# Patient Record
Sex: Male | Born: 1960 | Race: White | Hispanic: No | Marital: Single | Smoking: Never smoker
Health system: Southern US, Community
[De-identification: ages and names within clinical notes are randomized; demographics above are authoritative.]

## PROBLEM LIST (undated history)

## (undated) DIAGNOSIS — G8929 Other chronic pain: Secondary | ICD-10-CM

## (undated) DIAGNOSIS — I10 Essential (primary) hypertension: Secondary | ICD-10-CM

## (undated) DIAGNOSIS — M545 Low back pain: Secondary | ICD-10-CM

## (undated) DIAGNOSIS — M1990 Unspecified osteoarthritis, unspecified site: Secondary | ICD-10-CM

## (undated) DIAGNOSIS — G40909 Epilepsy, unspecified, not intractable, without status epilepticus: Secondary | ICD-10-CM

## (undated) DIAGNOSIS — R5383 Other fatigue: Secondary | ICD-10-CM

## (undated) HISTORY — PX: OTHER SURGICAL HISTORY: SHX169

## (undated) HISTORY — DX: Epilepsy, unspecified, not intractable, without status epilepticus: G40.909

## (undated) HISTORY — PX: CHOLECYSTECTOMY: SHX55

## (undated) HISTORY — DX: Low back pain: M54.5

## (undated) HISTORY — DX: Other chronic pain: G89.29

## (undated) HISTORY — DX: Unspecified osteoarthritis, unspecified site: M19.90

## (undated) HISTORY — DX: Essential (primary) hypertension: I10

## (undated) HISTORY — DX: Other fatigue: R53.83

---

## 2005-03-22 ENCOUNTER — Inpatient Hospital Stay (HOSPITAL_COMMUNITY): Admission: EM | Admit: 2005-03-22 | Discharge: 2005-03-23 | Payer: Self-pay | Admitting: Emergency Medicine
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
## 2014-08-27 DIAGNOSIS — G894 Chronic pain syndrome: Secondary | ICD-10-CM | POA: Diagnosis not present

## 2014-08-27 DIAGNOSIS — R413 Other amnesia: Secondary | ICD-10-CM | POA: Diagnosis not present

## 2015-04-03 DIAGNOSIS — G894 Chronic pain syndrome: Secondary | ICD-10-CM | POA: Diagnosis not present

## 2015-04-03 DIAGNOSIS — R413 Other amnesia: Secondary | ICD-10-CM | POA: Diagnosis not present

## 2015-08-14 DIAGNOSIS — R413 Other amnesia: Secondary | ICD-10-CM | POA: Diagnosis not present

## 2015-08-14 DIAGNOSIS — G894 Chronic pain syndrome: Secondary | ICD-10-CM | POA: Diagnosis not present

## 2016-02-19 DIAGNOSIS — R413 Other amnesia: Secondary | ICD-10-CM | POA: Diagnosis not present

## 2016-02-19 DIAGNOSIS — G894 Chronic pain syndrome: Secondary | ICD-10-CM | POA: Diagnosis not present

## 2016-11-16 DIAGNOSIS — R413 Other amnesia: Secondary | ICD-10-CM | POA: Diagnosis not present

## 2016-11-16 DIAGNOSIS — G894 Chronic pain syndrome: Secondary | ICD-10-CM | POA: Diagnosis not present

## 2017-08-25 DIAGNOSIS — Z125 Encounter for screening for malignant neoplasm of prostate: Secondary | ICD-10-CM | POA: Diagnosis not present

## 2017-08-25 DIAGNOSIS — I10 Essential (primary) hypertension: Secondary | ICD-10-CM | POA: Diagnosis not present

## 2017-08-25 DIAGNOSIS — Z13220 Encounter for screening for lipoid disorders: Secondary | ICD-10-CM | POA: Diagnosis not present

## 2017-08-31 DIAGNOSIS — M545 Low back pain: Secondary | ICD-10-CM | POA: Diagnosis not present

## 2017-08-31 DIAGNOSIS — Z1331 Encounter for screening for depression: Secondary | ICD-10-CM | POA: Diagnosis not present

## 2017-08-31 DIAGNOSIS — R5382 Chronic fatigue, unspecified: Secondary | ICD-10-CM | POA: Diagnosis not present

## 2017-08-31 DIAGNOSIS — M159 Polyosteoarthritis, unspecified: Secondary | ICD-10-CM | POA: Diagnosis not present

## 2017-08-31 DIAGNOSIS — I10 Essential (primary) hypertension: Secondary | ICD-10-CM | POA: Diagnosis not present

## 2017-09-07 DIAGNOSIS — I10 Essential (primary) hypertension: Secondary | ICD-10-CM | POA: Diagnosis not present

## 2017-09-07 DIAGNOSIS — M159 Polyosteoarthritis, unspecified: Secondary | ICD-10-CM | POA: Diagnosis not present

## 2017-09-07 DIAGNOSIS — G894 Chronic pain syndrome: Secondary | ICD-10-CM | POA: Diagnosis not present

## 2017-09-07 DIAGNOSIS — M545 Low back pain: Secondary | ICD-10-CM | POA: Diagnosis not present

## 2017-09-12 DIAGNOSIS — M1612 Unilateral primary osteoarthritis, left hip: Secondary | ICD-10-CM | POA: Diagnosis not present

## 2017-09-19 DIAGNOSIS — I10 Essential (primary) hypertension: Secondary | ICD-10-CM | POA: Diagnosis not present

## 2017-09-19 DIAGNOSIS — M545 Low back pain: Secondary | ICD-10-CM | POA: Diagnosis not present

## 2017-09-19 DIAGNOSIS — G894 Chronic pain syndrome: Secondary | ICD-10-CM | POA: Diagnosis not present

## 2017-09-19 DIAGNOSIS — M159 Polyosteoarthritis, unspecified: Secondary | ICD-10-CM | POA: Diagnosis not present

## 2017-09-26 DIAGNOSIS — G894 Chronic pain syndrome: Secondary | ICD-10-CM | POA: Diagnosis not present

## 2017-09-26 DIAGNOSIS — M159 Polyosteoarthritis, unspecified: Secondary | ICD-10-CM | POA: Diagnosis not present

## 2017-09-26 DIAGNOSIS — M545 Low back pain: Secondary | ICD-10-CM | POA: Diagnosis not present

## 2017-09-26 DIAGNOSIS — I10 Essential (primary) hypertension: Secondary | ICD-10-CM | POA: Diagnosis not present

## 2017-09-27 DIAGNOSIS — Z79899 Other long term (current) drug therapy: Secondary | ICD-10-CM | POA: Diagnosis not present

## 2017-09-27 DIAGNOSIS — G894 Chronic pain syndrome: Secondary | ICD-10-CM | POA: Diagnosis not present

## 2017-09-27 DIAGNOSIS — M5136 Other intervertebral disc degeneration, lumbar region: Secondary | ICD-10-CM | POA: Diagnosis not present

## 2017-09-27 DIAGNOSIS — M1612 Unilateral primary osteoarthritis, left hip: Secondary | ICD-10-CM | POA: Diagnosis not present

## 2017-10-10 DIAGNOSIS — G894 Chronic pain syndrome: Secondary | ICD-10-CM | POA: Diagnosis not present

## 2017-10-10 DIAGNOSIS — M159 Polyosteoarthritis, unspecified: Secondary | ICD-10-CM | POA: Diagnosis not present

## 2017-10-10 DIAGNOSIS — M545 Low back pain: Secondary | ICD-10-CM | POA: Diagnosis not present

## 2017-10-10 DIAGNOSIS — I10 Essential (primary) hypertension: Secondary | ICD-10-CM | POA: Diagnosis not present

## 2017-11-01 ENCOUNTER — Encounter: Payer: Self-pay | Admitting: *Deleted

## 2017-11-01 ENCOUNTER — Ambulatory Visit: Payer: Medicare Other | Admitting: Diagnostic Neuroimaging

## 2017-11-01 ENCOUNTER — Encounter: Payer: Self-pay | Admitting: Diagnostic Neuroimaging

## 2017-11-01 VITALS — BP 148/72 | HR 92 | Ht 67.5 in | Wt 325.0 lb

## 2017-11-01 DIAGNOSIS — G894 Chronic pain syndrome: Secondary | ICD-10-CM

## 2017-11-01 DIAGNOSIS — R251 Tremor, unspecified: Secondary | ICD-10-CM
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
## 2017-11-01 NOTE — Patient Instructions (Signed)
-  continue current medications

## 2017-11-10 DIAGNOSIS — I10 Essential (primary) hypertension: Secondary | ICD-10-CM | POA: Diagnosis not present

## 2017-11-10 DIAGNOSIS — G894 Chronic pain syndrome: Secondary | ICD-10-CM | POA: Diagnosis not present

## 2017-11-10 DIAGNOSIS — M545 Low back pain: Secondary | ICD-10-CM | POA: Diagnosis not present

## 2017-11-10 DIAGNOSIS — M159 Polyosteoarthritis, unspecified: Secondary | ICD-10-CM | POA: Diagnosis not present

## 2017-12-22 DIAGNOSIS — M545 Low back pain: Secondary | ICD-10-CM | POA: Diagnosis not present

## 2017-12-22 DIAGNOSIS — M159 Polyosteoarthritis, unspecified: Secondary | ICD-10-CM | POA: Diagnosis not present

## 2017-12-22 DIAGNOSIS — I10 Essential (primary) hypertension: Secondary | ICD-10-CM | POA: Diagnosis not present

## 2017-12-22 DIAGNOSIS — G894 Chronic pain syndrome: Secondary | ICD-10-CM | POA: Diagnosis not present

## 2018-02-21 ENCOUNTER — Other Ambulatory Visit: Payer: Self-pay

## 2018-02-21 NOTE — Patient Outreach (Signed)
[facility] Care Management  02/21/2018  Florencia ReasonsSteven B Maragh [birth year] [id number]   Medication Adherence call to Mr. Nathaniel PieriniSteve Aubuchon left a message for patient to call back patient is due on Lisinopril / hctz 10/12.5 mg.Mr. Casimiro NeedleMichael is showing past due under [facility].  Lillia AbedAna Ollison-Moran CPhT Pharmacy Technician [facility] Care Management Direct Dial [phone]  Fax [phone] [email or web address]

## 2018-02-23 DIAGNOSIS — I10 Essential (primary) hypertension: Secondary | ICD-10-CM | POA: Diagnosis not present

## 2018-02-23 DIAGNOSIS — E559 Vitamin D deficiency, unspecified: Secondary | ICD-10-CM | POA: Diagnosis not present

## 2018-02-23 DIAGNOSIS — M159 Polyosteoarthritis, unspecified: Secondary | ICD-10-CM | POA: Diagnosis not present

## 2018-02-23 DIAGNOSIS — G894 Chronic pain syndrome: Secondary | ICD-10-CM | POA: Diagnosis not present

## 2018-02-23 DIAGNOSIS — M545 Low back pain: Secondary | ICD-10-CM | POA: Diagnosis not present

## 2018-03-05 ENCOUNTER — Other Ambulatory Visit: Payer: Self-pay

## 2018-03-05 NOTE — Patient Outreach (Signed)
[facility] Care Management  03/05/2018  Nathaniel ReasonsSteven B Lyons 02-21-1961 [id number]   Medication Adherence call to Mr. Nathaniel PieriniSteve Lyons spoke with patient he ask to help him order Lisinopril/ Hctz 10/12.5 mg from [facility], call [facility] they need a prescription for a 90 days supply from the doctor office,call Doctor Nedra HaiLee left a message with doctor's nurse to send in a new prescription for a 90 days supply on Lisinopril/Hctz 10/12.5 mg to [facility], call patient back letiing him know he has to wait for the doctor to send in the prescription to [facility]. Nathaniel Lyons is showing past due under [facility].  Nathaniel AbedAna Lyons CPhT Pharmacy Technician [facility] Direct Dial [phone]  Fax [phone] [email or web address]

## 2018-06-04 DIAGNOSIS — E559 Vitamin D deficiency, unspecified: Secondary | ICD-10-CM | POA: Diagnosis not present

## 2018-06-04 DIAGNOSIS — M545 Low back pain: Secondary | ICD-10-CM | POA: Diagnosis not present

## 2018-06-04 DIAGNOSIS — G894 Chronic pain syndrome: Secondary | ICD-10-CM | POA: Diagnosis not present

## 2018-06-04 DIAGNOSIS — L0390 Cellulitis, unspecified: Secondary | ICD-10-CM | POA: Diagnosis not present

## 2018-06-04 DIAGNOSIS — M159 Polyosteoarthritis, unspecified: Secondary | ICD-10-CM | POA: Diagnosis not present

## 2018-06-12 DIAGNOSIS — E559 Vitamin D deficiency, unspecified: Secondary | ICD-10-CM | POA: Diagnosis not present

## 2018-06-12 DIAGNOSIS — M545 Low back pain: Secondary | ICD-10-CM | POA: Diagnosis not present

## 2018-06-12 DIAGNOSIS — G894 Chronic pain syndrome: Secondary | ICD-10-CM | POA: Diagnosis not present

## 2018-06-12 DIAGNOSIS — M159 Polyosteoarthritis, unspecified: Secondary | ICD-10-CM | POA: Diagnosis not present

## 2018-06-12 DIAGNOSIS — I10 Essential (primary) hypertension: Secondary | ICD-10-CM | POA: Diagnosis not present

## 2018-09-13 DIAGNOSIS — R195 Other fecal abnormalities: Secondary | ICD-10-CM | POA: Diagnosis not present

## 2018-09-13 DIAGNOSIS — M545 Low back pain: Secondary | ICD-10-CM | POA: Diagnosis not present

## 2018-09-13 DIAGNOSIS — I10 Essential (primary) hypertension: Secondary | ICD-10-CM | POA: Diagnosis not present

## 2018-09-13 DIAGNOSIS — M159 Polyosteoarthritis, unspecified: Secondary | ICD-10-CM | POA: Diagnosis not present

## 2018-09-13 DIAGNOSIS — G894 Chronic pain syndrome: Secondary | ICD-10-CM | POA: Diagnosis not present

## 2018-12-12 DIAGNOSIS — M159 Polyosteoarthritis, unspecified: Secondary | ICD-10-CM | POA: Diagnosis not present

## 2018-12-12 DIAGNOSIS — R195 Other fecal abnormalities: Secondary | ICD-10-CM | POA: Diagnosis not present

## 2018-12-12 DIAGNOSIS — I10 Essential (primary) hypertension: Secondary | ICD-10-CM | POA: Diagnosis not present

## 2018-12-12 DIAGNOSIS — M545 Low back pain: Secondary | ICD-10-CM | POA: Diagnosis not present

## 2019-01-09 DIAGNOSIS — I10 Essential (primary) hypertension: Secondary | ICD-10-CM | POA: Diagnosis not present

## 2019-01-09 DIAGNOSIS — G894 Chronic pain syndrome: Secondary | ICD-10-CM | POA: Diagnosis not present

## 2019-01-09 DIAGNOSIS — M159 Polyosteoarthritis, unspecified: Secondary | ICD-10-CM | POA: Diagnosis not present

## 2019-01-09 DIAGNOSIS — M545 Low back pain: Secondary | ICD-10-CM | POA: Diagnosis not present

## 2019-01-09 DIAGNOSIS — R195 Other fecal abnormalities: Secondary | ICD-10-CM | POA: Diagnosis not present

## 2019-06-11 DIAGNOSIS — R0602 Shortness of breath: Secondary | ICD-10-CM | POA: Diagnosis not present

## 2019-06-11 DIAGNOSIS — I10 Essential (primary) hypertension: Secondary | ICD-10-CM | POA: Diagnosis not present

## 2019-06-11 DIAGNOSIS — R079 Chest pain, unspecified: Secondary | ICD-10-CM | POA: Diagnosis not present

## 2019-06-11 DIAGNOSIS — U071 COVID-19: Secondary | ICD-10-CM | POA: Diagnosis not present

## 2019-06-11 DIAGNOSIS — R072 Precordial pain: Secondary | ICD-10-CM | POA: Diagnosis not present

## 2019-06-11 DIAGNOSIS — R000 Tachycardia, unspecified: Secondary | ICD-10-CM | POA: Diagnosis not present

## 2019-06-11 DIAGNOSIS — M5489 Other dorsalgia: Secondary | ICD-10-CM | POA: Diagnosis not present

## 2019-06-11 DIAGNOSIS — R0789 Other chest pain: Secondary | ICD-10-CM | POA: Diagnosis not present

## 2019-06-11 DIAGNOSIS — E7800 Pure hypercholesterolemia, unspecified: Secondary | ICD-10-CM | POA: Diagnosis not present

## 2019-12-11 DIAGNOSIS — M545 Low back pain: Secondary | ICD-10-CM | POA: Diagnosis not present

## 2019-12-11 DIAGNOSIS — I10 Essential (primary) hypertension: Secondary | ICD-10-CM | POA: Diagnosis not present

## 2019-12-11 DIAGNOSIS — G8929 Other chronic pain: Secondary | ICD-10-CM | POA: Diagnosis not present

## 2019-12-11 DIAGNOSIS — M159 Polyosteoarthritis, unspecified: Secondary | ICD-10-CM | POA: Diagnosis not present

## 2019-12-11 DIAGNOSIS — R195 Other fecal abnormalities: Secondary | ICD-10-CM | POA: Diagnosis not present

## 2019-12-25 DIAGNOSIS — G894 Chronic pain syndrome: Secondary | ICD-10-CM | POA: Diagnosis not present

## 2019-12-25 DIAGNOSIS — I10 Essential (primary) hypertension: Secondary | ICD-10-CM | POA: Diagnosis not present

## 2019-12-25 DIAGNOSIS — M545 Low back pain: Secondary | ICD-10-CM | POA: Diagnosis not present

## 2019-12-25 DIAGNOSIS — R195 Other fecal abnormalities: Secondary | ICD-10-CM | POA: Diagnosis not present

## 2024-03-09 DIAGNOSIS — R300 Dysuria: Secondary | ICD-10-CM | POA: Diagnosis not present

## 2024-03-09 DIAGNOSIS — R319 Hematuria, unspecified: Secondary | ICD-10-CM | POA: Diagnosis not present

## 2024-03-09 DIAGNOSIS — N200 Calculus of kidney: Secondary | ICD-10-CM | POA: Diagnosis not present

## 2024-03-09 DIAGNOSIS — I10 Essential (primary) hypertension: Secondary | ICD-10-CM | POA: Diagnosis not present

## 2024-03-09 DIAGNOSIS — R030 Elevated blood-pressure reading, without diagnosis of hypertension: Secondary | ICD-10-CM | POA: Diagnosis not present

## 2024-03-18 DIAGNOSIS — Z13228 Encounter for screening for other metabolic disorders: Secondary | ICD-10-CM | POA: Diagnosis not present

## 2024-03-18 DIAGNOSIS — Z136 Encounter for screening for cardiovascular disorders: Secondary | ICD-10-CM | POA: Diagnosis not present

## 2024-03-18 DIAGNOSIS — R2242 Localized swelling, mass and lump, left lower limb: Secondary | ICD-10-CM | POA: Diagnosis not present

## 2024-03-18 DIAGNOSIS — N200 Calculus of kidney: Secondary | ICD-10-CM | POA: Diagnosis not present

## 2024-03-18 DIAGNOSIS — Z131 Encounter for screening for diabetes mellitus: Secondary | ICD-10-CM | POA: Diagnosis not present

## 2024-03-18 DIAGNOSIS — R3121 Asymptomatic microscopic hematuria: Secondary | ICD-10-CM | POA: Diagnosis not present

## 2024-03-18 DIAGNOSIS — Z13220 Encounter for screening for lipoid disorders: Secondary | ICD-10-CM | POA: Diagnosis not present

## 2024-03-18 DIAGNOSIS — R39198 Other difficulties with micturition: Secondary | ICD-10-CM | POA: Diagnosis not present

## 2024-03-21 DIAGNOSIS — M79652 Pain in left thigh: Secondary | ICD-10-CM | POA: Diagnosis not present

## 2024-03-21 DIAGNOSIS — M7989 Other specified soft tissue disorders: Secondary | ICD-10-CM | POA: Diagnosis not present

## 2024-03-25 DIAGNOSIS — R2242 Localized swelling, mass and lump, left lower limb: Secondary | ICD-10-CM | POA: Diagnosis not present

## 2024-03-26 DIAGNOSIS — R0602 Shortness of breath: Secondary | ICD-10-CM | POA: Diagnosis not present

## 2024-03-29 DIAGNOSIS — R0600 Dyspnea, unspecified: Secondary | ICD-10-CM | POA: Diagnosis not present

## 2024-04-01 DIAGNOSIS — R600 Localized edema: Secondary | ICD-10-CM | POA: Diagnosis not present

## 2024-04-01 DIAGNOSIS — R3121 Asymptomatic microscopic hematuria: Secondary | ICD-10-CM | POA: Diagnosis not present

## 2024-04-01 DIAGNOSIS — I10 Essential (primary) hypertension: Secondary | ICD-10-CM | POA: Diagnosis not present

## 2024-04-01 DIAGNOSIS — R0602 Shortness of breath: Secondary | ICD-10-CM | POA: Diagnosis not present

## 2024-04-01 DIAGNOSIS — R39198 Other difficulties with micturition: Secondary | ICD-10-CM | POA: Diagnosis not present

## 2024-04-01 DIAGNOSIS — R202 Paresthesia of skin: Secondary | ICD-10-CM | POA: Diagnosis not present

## 2024-04-01 DIAGNOSIS — N132 Hydronephrosis with renal and ureteral calculous obstruction: Secondary | ICD-10-CM | POA: Diagnosis not present

## 2024-04-06 DIAGNOSIS — R0600 Dyspnea, unspecified: Secondary | ICD-10-CM | POA: Diagnosis not present
